# Patient Record
Sex: Female | Born: 1940
Health system: Southern US, Community
[De-identification: ages and names within clinical notes are randomized; demographics above are authoritative.]

## PROBLEM LIST (undated history)

## (undated) DIAGNOSIS — E119 Type 2 diabetes mellitus without complications: Secondary | ICD-10-CM

## (undated) DIAGNOSIS — I10 Essential (primary) hypertension: Secondary | ICD-10-CM

---

## 2015-10-14 ENCOUNTER — Telehealth: Payer: Self-pay | Admitting: *Deleted

## 2015-10-14 NOTE — Telephone Encounter (Signed)
EDCM met with pt and grand daughter Michelle Hood) this morning regarding orange card process.  EDCM and [place]clair [place], [place] Specialist called Michelle Hood and advised her to keep appointment with [facility] financial counselor as they will be able to help them navigate the process of obtaining PCP and needed referrals.  Michelle Hood very appreciative of information provided and advise given.

## 2015-10-21 ENCOUNTER — Ambulatory Visit: Payer: Self-pay | Attending: Internal Medicine

## 2015-10-23 LAB — GLUCOSE, POCT (MANUAL RESULT ENTRY): POC GLUCOSE: 91 mg/dL (ref 70–99)

## 2015-11-03 ENCOUNTER — Encounter: Payer: Self-pay | Admitting: Internal Medicine

## 2015-11-03 ENCOUNTER — Ambulatory Visit: Payer: Self-pay | Attending: Internal Medicine | Admitting: Internal Medicine

## 2015-11-03 ENCOUNTER — Ambulatory Visit (HOSPITAL_COMMUNITY)
Admission: RE | Admit: 2015-11-03 | Discharge: 2015-11-03 | Disposition: A | Discharge status: Home / Self Care | Payer: Self-pay | Source: Ambulatory Visit | Attending: Internal Medicine | Admitting: Internal Medicine

## 2015-11-03 VITALS — BP 176/98 | HR 78 | Temp 98.3°F | Wt 168.2 lb

## 2015-11-03 DIAGNOSIS — M25561 Pain in right knee: Secondary | ICD-10-CM | POA: Insufficient documentation

## 2015-11-03 DIAGNOSIS — R6889 Other general symptoms and signs: Secondary | ICD-10-CM

## 2015-11-03 DIAGNOSIS — M25562 Pain in left knee: Secondary | ICD-10-CM | POA: Insufficient documentation

## 2015-11-03 DIAGNOSIS — I10 Essential (primary) hypertension: Secondary | ICD-10-CM | POA: Insufficient documentation

## 2015-11-03 DIAGNOSIS — H578 Other specified disorders of eye and adnexa: Secondary | ICD-10-CM | POA: Insufficient documentation

## 2015-11-03 LAB — CBC WITH DIFFERENTIAL/PLATELET
BASOS ABS: 0 {cells}/uL (ref 0–200)
Basophils Relative: 0 %
EOS ABS: 100 {cells}/uL (ref 15–500)
Eosinophils Relative: 1 %
HCT: 43.9 % (ref 35.0–45.0)
Hemoglobin: 14.6 g/dL (ref 11.7–15.5)
LYMPHS PCT: 25 %
Lymphs Abs: 2500 cells/uL (ref 850–3900)
MCH: 28.1 pg (ref 27.0–33.0)
MCHC: 33.3 g/dL (ref 32.0–36.0)
MCV: 84.4 fL (ref 80.0–100.0)
MONOS PCT: 8 %
MPV: 10.3 fL (ref 7.5–12.5)
Monocytes Absolute: 800 cells/uL (ref 200–950)
NEUTROS ABS: 6600 {cells}/uL (ref 1500–7800)
Neutrophils Relative %: 66 %
PLATELETS: 268 10*3/uL (ref 140–400)
RBC: 5.2 MIL/uL — ABNORMAL HIGH (ref 3.80–5.10)
RDW: 14.6 % (ref 11.0–15.0)
WBC: 10 10*3/uL (ref 3.8–10.8)

## 2015-11-03 MED ORDER — HYDROCHLOROTHIAZIDE 25 MG PO TABS: 25.0000 mg | ORAL_TABLET | Freq: Every day | ORAL | Status: DC

## 2015-11-03 MED ORDER — LORATADINE 10 MG PO TABS: 10.0000 mg | ORAL_TABLET | Freq: Every day | ORAL | Status: AC

## 2015-11-03 MED ORDER — DICLOFENAC SODIUM 1 % TD GEL: 2.0000 g | Freq: Four times a day (QID) | TRANSDERMAL | Status: AC

## 2015-11-03 MED ORDER — SALINE SENSITIVE EYES SOLN: 1.0000 "application " | Status: AC | PRN

## 2015-11-03 MED ORDER — TRAMADOL HCL 50 MG PO TABS: 50.0000 mg | ORAL_TABLET | Freq: Three times a day (TID) | ORAL | Status: AC | PRN

## 2015-11-03 MED ORDER — METOPROLOL TARTRATE 50 MG PO TABS: 50.0000 mg | ORAL_TABLET | Freq: Two times a day (BID) | ORAL | Status: DC

## 2015-11-03 MED FILL — HYDROCHLOROTHIAZIDE 25 MG T: 25 | 30 days supply | Qty: 30 | Fill #0

## 2015-11-03 MED FILL — VOLTAREN 1% GEL: 1 | 20 days supply | Qty: 100 | Fill #0

## 2015-11-03 MED FILL — ?METOPROLOL 50 MG TABLET: 50 | 30 days supply | Qty: 60 | Fill #0

## 2015-11-03 MED FILL — traMADol HCL 50 MG TABS: 50 | 10 days supply | Qty: 30 | Fill #0

## 2015-11-03 MED FILL — LORATADINE 10 MG TABLET: 10 | 30 days supply | Qty: 30 | Fill #0

## 2015-11-03 NOTE — Patient Instructions (Signed)
Plan de alimentacin con bajo contenido de sodio (Low-Sodium Eating Plan) El sodio aumenta la presin arterial y hace que el cuerpo retenga lquidos. El consumo de alimentos con menos sodio ayuda a Conservator, museum/gallery presin arterial, a Building services engineer y a Physicist, medical, el hgado y los riones. Agregar sal (cloruro de sodio) a los alimentos aumenta el aporte de [place]. La mayor parte del sodio proviene de los alimentos enlatados, envasados y congelados. La pizza, la comida rpida y la comida de los restaurantes tambin contienen mucho sodio. Aunque usted tome medicamentos para bajar la presin arterial o reducir el lquido del cuerpo, es importante que disminuya el aporte de sodio de los alimentos. EN QU CONSISTE EL PLAN? La Harley-Davidson de las personas deberan limitar la ingesta de sodio a 2300mg  por Futures trader. El mdico le recomienda que limite su consumo de sodio a 2 grams Google.  QU DEBO SABER ACERCA DE ESTE PLAN DE ALIMENTACIN? Para el plan de alimentacin con bajo contenido de sodio, debe seguir estas pautas generales:  Elija alimentos con un valor porcentual diario de sodio de menos del 5% (segn se indica en la etiqueta).  Use hierbas o aderezos sin sal, en lugar de sal de mesa o sal marina.  Consulte al mdico o farmacutico antes de usar sustitutos de la sal.  Coma alimentos frescos.  Coma ms frutas y verduras.  Limite las verduras enlatadas. Si las consume, enjuguelas bien para disminuir el sodio.  Limite el consumo de queso a 1onza (28g) por Futures trader.  Coma productos con bajo contenido de sodio, cuya etiqueta suele decir "bajo contenido de sodio" o "sin agregado de sal".  Evite alimentos que contengan glutamato monosdico (MSG), que a veces se agrega a la comida [place] y a algunos alimentos enlatados.  Consulte las etiquetas de los alimentos (etiquetas de informacin nutricional) para saber cunto sodio contiene una porcin.  Consuma ms comida casera y menos de restaurante,  de buf y comida rpida.  Cuando coma en un restaurante, pida que preparen su comida con menos sal o, en lo posible, sin nada de sal. CMO LEO LA INFORMACIN SOBRE EL SODIO EN LAS ETIQUETAS DE LOS ALIMENTOS? La etiqueta de informacin nutricional indica la cantidad de sodio en una porcin de alimento. Si come ms de una porcin, debe multiplicar la cantidad indicada de sodio por la cantidad de porciones. Las etiquetas de los alimentos tambin pueden indicar lo siguiente:  Sin sodio: menos de 5mg  por porcin.  Cantidad muy baja de sodio: 35mg  o menos por porcin.  Cantidad baja de sodio: 140mg  o menos por porcin.  Menor cantidad de sodio: 50% menos de sodio en una porcin. Por ejemplo, si un alimento generalmente contiene 300 mg de sodio se modifica para ser Edison International, tendr 150 mg de sodio.  Sodio reducido: 25% menos de Industrial/product designer. Por ejemplo, si un alimento que por lo general contiene 400mg  de sodio se modifica para convertirse en un alimento de sodio reducido, tendr 300mg  de sodio. QU ALIMENTOS PUEDO COMER? Cereales Cereales con bajo contenido de sodio, como [place], arroz y trigo [place], y trigo triturado. Galletas con bajo contenido de [place]. Arroz y pastas sin sal. Pan con bajo contenido de [place].  Verduras Verduras frescas o congeladas. Verduras enlatadas con bajo contenido de sodio o reducido de sodio. Pasta y salsa de tomate con contenido bajo o reducido de sodio. Jugos de tomate y verduras con contenido bajo o reducido de sodio.  Nils Pyle Frutas frescas,  congeladas y enlatadas. Jugo de frutas.  Carnes y otros productos con protenas Atn y salmn enlatado con bajo contenido de [place]. Carne de vaca o ave, pescado y frutos de mar frescos o congelados. Cordero. Frutos secos sin sal. Lentejas, frijoles y guisantes secos, sin sal agregada. Frijoles enlatados sin sal. Sopas caseras sin sal. Huevos.  Lcteos Leche. Leche de soja. Queso ricota. Quesos  con contenido bajo o reducido de sodio. Yogur.  Condimentos Hierbas y especias frescas y secas. Aderezos sin sal. Cebolla y ajo en polvo. Variedades de mostaza y ketchup con bajo contenido de [place]. Rbano picante fresco o refrigerado. Jugo de limn.  Grasas y aceites Aderezos para ensalada con contenido reducido de [place]. Mantequilla sin sal.  Otros Palomitas de maz y pretzels sin sal.  Los artculos mencionados arriba pueden no ser Raytheon de las bebidas o los alimentos recomendados. Comunquese con el nutricionista para conocer ms opciones. QU ALIMENTOS NO SE RECOMIENDAN? Cereales  Cereales instantneos para comer caliente. Mezclas para bizcochos, panqueques y rellenos de pan. Crutones. Mezclas para pastas o arroz con condimento. Envases comerciales de sopa de fideos. Macarrones con queso envasados o congelados. Harina leudante. Galletas saladas comunes. Verduras Verduras enlatadas comunes. Pasta y salsa de tomate en lata comunes. Jugos comunes de tomate y de verduras. Verduras Hydrologist. Papas fritas saladas. Aceitunas. Pepinillos. Salsas. Chucrut. Salsa. Carnes y otros productos con protenas Carne de vaca, pescado o frutos de mar que est salada, [employer], [place], condimentada con especias o con pickles. Panceta, jamn, salchichas, perros calientes, carne curada, carne picada (carne envasada de buey) y embutidos. Cerdo salado. Cecina o charqui. Arenque en escabeche. Anchoas, atn enlatado comn y sardinas. Frutos secos con sal. Celine Mans para untar y quesos procesados. Requesn. Queso azul y cottage. Suero de [place].  Condimentos Sal de cebolla y ajo, sal condimentada, sal de mesa y sal marina. Salsas en lata y envasadas. Salsa [place]. Salsa trtara. Salsa barbacoa. Salsa teriyaki. Salsa de soja, incluso la que tiene contenido reducido de [place]. Salsa de carne. Salsa de pescado. Salsa de [place]. Salsa rosada. Rbano picante envasado. Ketchup y mostaza  comunes. Saborizantes y tiernizantes para carne. Caldo en cubitos. Salsa picante. Salsa tabasco. Adobos. Aderezos para tacos. Salsas. Grasas y aceites Aderezos comunes para ensalada. Mantequilla con sal. Margarina. Mantequilla clarificada. Grasa de panceta.  Otros Nachos y papas fritas envasadas. Maz inflado y frituras de maz. Palomitas de maz y pretzels con sal. Sopas enlatadas o en polvo. Pizza. Pasteles y entradas congeladas.  Los artculos mencionados arriba pueden no ser Raytheon de las bebidas y los alimentos que se Theatre stage manager. Comunquese con el nutricionista para obtener ms informacin.   Esta informacin no tiene Theme park manager el consejo del mdico. Asegrese de hacerle al mdico cualquier pregunta que tenga.   Document Released: 05/23/2005 Document Revised: 06/13/2014 Elsevier Interactive Patient Education 2016 [employer].  - Plan de alimentacin DASH (DASH Eating Plan) DASH es la sigla en ingls de "Enfoques Alimentarios para Detener la Hipertensin". El plan de alimentacin DASH ha demostrado bajar la presin arterial elevada (hipertensin). Los beneficios adicionales para la salud pueden incluir la disminucin del riesgo de diabetes mellitus tipo2, enfermedades cardacas e ictus. Este plan tambin puede ayudar a Geophysical data processor. QU DEBO SABER ACERCA DEL PLAN DE ALIMENTACIN DASH? Para el plan de alimentacin DASH, seguir las siguientes pautas generales:  Elija los alimentos con un valor porcentual diario de sodio de menos del 5% (segn figura en la etiqueta del alimento).  Use hierbas o aderezos sin sal, en lugar de sal de mesa o sal marina.  Consulte al mdico o farmacutico antes de usar sustitutos de la sal.  Coma productos con bajo contenido de sodio, cuya etiqueta suele decir "bajo contenido de sodio" o "sin agregado de sal".  Coma alimentos frescos.  Coma ms verduras, frutas y productos lcteos con bajo contenido de [place].  Elija los  cereales integrales. Busque la palabra "integral" en Estate agent de la lista de ingredientes.  Elija el pescado y el pollo o el pavo sin piel ms a menudo que las carnes rojas. Limite el consumo de pescado, carne de ave y carne a 6onzas (170g) por Futures trader.  Limite el consumo de dulces, postres, azcares y bebidas azucaradas.  Elija las grasas saludables para el corazn.  Limite el consumo de queso a 1onza (28g) por Futures trader.  Consuma ms comida casera y menos de restaurante, de buf y comida rpida.  Limite el consumo de alimentos fritos.  Cocine los alimentos utilizando mtodos que no sean la fritura.  Limite las verduras enlatadas. Si las consume, enjuguelas bien para disminuir el sodio.  Cuando coma en un restaurante, pida que preparen su comida con menos sal o, en lo posible, sin nada de sal. QU ALIMENTOS PUEDO COMER? Pida ayuda a un nutricionista para conocer las necesidades calricas individuales. Cereales Pan de salvado o integral. Arroz integral. Pastas de salvado o integrales. Quinua, trigo burgol y cereales integrales. Cereales con bajo contenido de sodio. Tortillas de harina de maz o de salvado. Pan de maz integral. Galletas saladas integrales. Galletas con bajo contenido de [place]. Vegetales Verduras frescas o congeladas (crudas, al vapor, asadas o grilladas). Jugos de tomate y verduras con contenido bajo o reducido de sodio. Pasta y salsa de tomate con contenido bajo o reducido de sodio. Verduras enlatadas con bajo contenido de sodio o reducido de sodio.  Nils Pyle Nils Pyle frescas, en conserva (en su jugo natural) o frutas congeladas. Carnes y otros productos con protenas Carne de res molida (al 85% o ms [place]), carne de res de animales alimentados con pastos o carne de res sin la grasa. Pollo o pavo sin piel. Carne de pollo o de [place]. Cerdo sin la grasa. Todos los pescados y frutos de mar. Huevos. Porotos, guisantes o lentejas secos. Frutos secos y semillas sin sal.  Frijoles enlatados sin sal. Lcteos Productos lcteos con bajo contenido de grasas, como [place] o al 1%, quesos reducidos en grasas o al 2%, ricota con bajo contenido de grasas o [employer], o yogur natural con bajo contenido de [place]. Quesos con contenido bajo o reducido de sodio. Grasas y Writer en barra que no contengan grasas trans. [place] y alios para ensaladas livianos o reducidos en grasas (reducidos en sodio). [place]. Aceites de crtamo, oliva o canola. Mantequilla natural de man o almendra. Otros Palomitas de maz y pretzels sin sal. Los artculos mencionados arriba pueden no ser Raytheon de las bebidas o los alimentos recomendados. Comunquese con el nutricionista para conocer ms opciones. QU ALIMENTOS NO SE RECOMIENDAN? Cereales Pan blanco. Pastas blancas. Arroz blanco. Pan de maz refinado. Bagels y croissants. Galletas saladas que contengan grasas trans. Vegetales Vegetales con crema o fritos. Verduras en salsa de [place]. Verduras enlatadas comunes. Pasta y salsa de tomate en lata comunes. Jugos comunes de tomate y de verduras. Nils Pyle Frutas secas. Fruta enlatada en almbar liviano o espeso. Jugo de frutas. Carnes y otros productos con protenas [place] de carne con  grasa. Costillas, alas de pollo, tocineta, salchicha, mortadela, salame, chinchulines, tocino, perros calientes, salchichas alemanas y embutidos envasados. Frutos secos y semillas con sal. Frijoles con sal en lata. Lcteos Leche entera o al 2%, crema, mezcla de [place] y crema, y queso crema. Yogur entero o endulzado. Quesos o queso azul con alto contenido de Neurosurgeon. Cremas no lcteas y coberturas batidas. Quesos procesados, quesos para untar o cuajadas. Condimentos Sal de cebolla y ajo, sal condimentada, sal de mesa y sal marina. Salsas en lata y envasadas. Salsa [place]. Salsa trtara. Salsa barbacoa. Salsa teriyaki. Salsa de soja, incluso la que tiene contenido  reducido de [place]. Salsa de carne. Salsa de pescado. Salsa de [place]. Salsa rosada. Rbano picante. Ketchup y mostaza. Saborizantes y tiernizantes para carne. Caldo en cubitos. Salsa picante. Salsa tabasco. Adobos. Aderezos para tacos. Salsas. Grasas y [address], [place] en barra, [place] de [place], [place], [place] clarificada y Steffanie [place] de tocino. Aceites de coco, de palmiste o de palma. Aderezos comunes para ensalada. Otros Pickles y [place]. Palomitas de maz y pretzels con sal. Los artculos mencionados arriba pueden no ser Raytheon de las bebidas y los alimentos que se Theatre stage manager. Comunquese con el nutricionista para obtener ms informacin. DNDE Raelyn Mora MS INFORMACIN? Instituto Nacional del [place], del Pulmn y de Risk manager (National Heart, Lung, and Blood Institute): [email or web address]   Esta informacin no tiene Theme park manager el consejo del mdico. Asegrese de hacerle al mdico cualquier pregunta que tenga.   Document Released: 05/12/2011 Document Revised: 06/13/2014 Elsevier Interactive Patient Education 2016 [employer].   - Dolor de rodilla (Knee Pain) El dolor de rodilla es un problema frecuente y puede tener muchas causas. A menudo desaparece si se siguen las instrucciones del mdico para el cuidado Facilities manager. El tratamiento del dolor continuo depender de su causa. Si el dolor persiste, tal vez haya que realizar ms estudios para Scientist, forensic, los cuales pueden incluir radiografas u otros estudios de diagnstico por imgenes de la rodilla. CUIDADOS EN EL HOGAR  Tome los medicamentos solamente como se lo haya indicado el mdico.  Mantenga la rodilla en reposo y en alto (elevada) mientras est descansando.  No haga cosas que le causen dolor o que lo intensifiquen.  Evite las Ball Corporation ambos pies se separan del suelo al mismo tiempo, por ejemplo, correr, saltar la soga o  hacer saltos de tijera.  Aplique hielo sobre la zona de la rodilla:  Ponga el hielo en una bolsa plstica.  Coloque una toalla entre la piel y la bolsa de hielo.  Coloque el hielo durante 20 minutos, 2 a 3 veces por da.  Pregntele al mdico si debe usar una Neurosurgeon.  Duerma con una almohada debajo de la rodilla.  Baje de peso si es necesario. El sobrepeso puede aumentar el dolor de rodilla.  No consuma ningn producto que contenga tabaco, lo que incluye cigarrillos, tabaco de Theatre manager o Administrator, Civil Service. Si necesita ayuda para dejar de fumar, consulte al mdico. Fumar puede retrasar la curacin de cualquier problema que tenga en el hueso y Nurse, learning disability. SOLICITE AYUDA SI:  El dolor de rodilla no desaparece, cambia o empeora.  Tiene fiebre junto con dolor de rodilla.  La rodilla le falla o se le queda trabada.  La rodilla est ms hinchada. SOLICITE AYUDA DE INMEDIATO SI:   La rodilla est caliente al tacto.  Tiene dolor en el pecho o dificultad para respirar.   Esta informacin no tiene  como fin reemplazar el consejo del mdico. Asegrese de hacerle al mdico cualquier pregunta que tenga.   Document Released: 12/18/2013 Elsevier Interactive Patient Education [employer]2016 Elsevier Inc.

## 2015-11-03 NOTE — Progress Notes (Signed)
Michelle Hood, is a 75 y.o. female  ZOX:[id number]  [id number]  DOB - 23-Jun-1940  CC:  Chief Complaint  Patient presents with  . New Patient (Initial Visit)    Hypertension       HPI: Michelle Hood is a 75 y.o. female here today to establish medical care, new to our clinic and system. Per pt and family (she is here w/ her Niece and grandniece) she has not seen a doctor in very long time.  She was recently dx w/ htn in [facility]	[place] RN program visit on 10/23/15, where they found her bp 195/107.  She had no sx at time.  Currently she is here for f/u. C/o mainly of bilateral knee pains, left > right that has been very painful these past few days.  Her knees having been bothering her this past year, but not to this extent til recently.  She has been taking tylenol and motrin OTC back and forth w/ minimal relief.    Patient has No headache, no visual problems/blurry vision,  No chest pain, no syncope, No abdominal pain - No Nausea, No new weakness tingling or numbness, No Cough - SOB.  + itchy eyes lately though, maybe allergies.  Interpreter was used to communicate directly with patient for the entire encounter including providing detailed patient instructions.   No Known Allergies No past medical history on file. No current outpatient prescriptions on file prior to visit.   No current facility-administered medications on file prior to visit.   No family history on file. Social History   Social History  . Marital Status: Single    Spouse Name: N/A  . Number of Children: N/A  . Years of Education: N/A   Occupational History  . Not on file.   Social History Main Topics  . Smoking status: Never Smoker   . Smokeless tobacco: Not on file  . Alcohol Use: No  . Drug Use: No  . Sexual Activity: Not on file   Other Topics Concern  . Not on file   Social History Narrative  . No narrative on file    Review of Systems: Per hPI, otherwise all systems  reviewed and negative.   Objective:   Filed Vitals:   11/03/15 1532 11/03/15 1534  BP: 191/79 176/98  Pulse: 84 78  Temp: 98.3 F (36.8 C)     Filed Weights   11/03/15 1532  Weight: 168 lb 3.2 oz (76.295 kg)    BP Readings from Last 3 Encounters:  11/03/15 176/98    Physical Exam: Constitutional: Patient appears well-developed and well-nourished. No distress. AAOx3, morbid obese., pleasant.  Gait very limited due to knee pain and obesity. HENT: Normocephalic, atraumatic, External right and left ear normal. Oropharynx is clear and moist.   bilat TMs clear. Eyes: Conjunctivae and EOM are normal. PERRL, no scleral icterus.  Neck: Normal ROM. Neck supple. No JVD. No tracheal deviation. No thyromegaly. CVS: RRR, S1/S2 +, no murmurs, no gallops, no carotid bruit.  Pulmonary: Effort and breath sounds normal, no stridor, rhonchi, wheezes, rales.  Abdominal: Soft. BS +, no distension, tenderness, rebound or guarding.  Musculoskeletal:   No edema/erythem/warthm /effusion noted of knees bilaterally.  TPP anterior aspect of bilateral knees, left > right knee, ttp w/ extension /flexion/rotation of knees.  Difficult exam though given pt's body habitus/.  LE: bilat/ no c/c/e, pulses 2+ bilateral. +varicose veins. Neuro: Alert.   muscle tone coordination. No cranial nerve deficit grossly. Skin: Skin is warm and  dry. No rash noted. Not diaphoretic. No erythema. No pallor. Psychiatric: Normal mood and affect. Behavior, judgment, thought content normal.  No results found for: WBC, HGB, HCT, MCV, PLT No results found for: CREATININE, BUN, NA, K, CL, CO2  No results found for: HGBA1C Lipid Panel  No results found for: CHOL, TRIG, HDL, CHOLHDL, VLDL, LDLCALC     [facility] 11/03/2015  Decreased Interest 0  Down, Depressed, Hopeless 0  PHQ - 2 Score 0  Altered sleeping 0  Tired, decreased energy 0  Change in appetite 0  Feeling bad or failure about yourself  0  Trouble  concentrating 0  Moving slowly or fidgety/restless 0  Suicidal thoughts 0  PHQ-9 Score 0  Difficult doing work/chores Not difficult at all    Assessment and plan:   1. Knee pain, bilateral, suspect arthritis/OA of knees. - Uric Acid - Sedimentation Rate - Vitamin D, 25-hydroxy - DG Knee Complete 4 Views Left; Future - DG Knee Complete 4 Views Right; Future - trial voltarin gel and ultram prn for now. - intructed pt to take motrin /NSAIDS w/ food  2. HTN (hypertension), malignant, suspect longstanding W/o ha/cp/concerning sx currenlty. - metoprolol 50bid, hctz 25 qday. - BASIC METABOLIC PANEL WITH GFR - CBC with Differential - rechk bp in 2 weeks - low salt/DASH diet dw pt and family.  3. Itchy eyes Suspect prurirtis due to allergies Trial claritin and saline eye drops.  4. Dm screening chk a1c  Return in about 2 weeks (around 11/17/2015) for htn /knee pain, bp check.  The patient was given clear instructions to go to ER or return to medical center if symptoms don't improve, worsen or new problems develop. The patient verbalized understanding. The patient was told to call to get lab results if they haven't heard anything in the next week.      Pete Glatterawn T Bonniejean Piano, MD, MBA/MHA [facility] [place], [place] [phone]   11/03/2015, 5:00 PM

## 2015-11-04 LAB — BASIC METABOLIC PANEL WITH GFR
BUN: 30 mg/dL — ABNORMAL HIGH (ref 7–25)
CALCIUM: 9.7 mg/dL (ref 8.6–10.4)
CO2: 26 mmol/L (ref 20–31)
CREATININE: 0.9 mg/dL (ref 0.60–0.93)
Chloride: 100 mmol/L (ref 98–110)
GFR, Est African American: 73 mL/min (ref >=60)
GFR, Est Non African American: 63 mL/min (ref >=60)
GLUCOSE: 85 mg/dL (ref 65–99)
Potassium: 4.3 mmol/L (ref 3.5–5.3)
Sodium: 138 mmol/L (ref 135–146)

## 2015-11-04 LAB — SEDIMENTATION RATE: Sed Rate: 6 mm/hr (ref 0–30)

## 2015-11-04 LAB — HEMOGLOBIN A1C
Hgb A1c MFr Bld: 6 % — ABNORMAL HIGH (ref <5.7)
Mean Plasma Glucose: 126 mg/dL

## 2015-11-04 LAB — URIC ACID: Uric Acid, Serum: 7.5 mg/dL — ABNORMAL HIGH (ref 2.4–7.0)

## 2015-11-04 LAB — VITAMIN D 25 HYDROXY (VIT D DEFICIENCY, FRACTURES): Vit D, 25-Hydroxy: 30 ng/mL (ref 30–100)

## 2015-11-11 ENCOUNTER — Other Ambulatory Visit: Payer: Self-pay | Admitting: Internal Medicine

## 2015-11-11 DIAGNOSIS — M170 Bilateral primary osteoarthritis of knee: Secondary | ICD-10-CM

## 2015-11-23 ENCOUNTER — Ambulatory Visit: Payer: Self-pay | Attending: Internal Medicine | Admitting: Internal Medicine

## 2015-11-23 DIAGNOSIS — K5900 Constipation, unspecified: Secondary | ICD-10-CM | POA: Insufficient documentation

## 2015-11-23 DIAGNOSIS — Z7984 Long term (current) use of oral hypoglycemic drugs: Secondary | ICD-10-CM | POA: Insufficient documentation

## 2015-11-23 DIAGNOSIS — M25561 Pain in right knee: Secondary | ICD-10-CM | POA: Insufficient documentation

## 2015-11-23 DIAGNOSIS — M1710 Unilateral primary osteoarthritis, unspecified knee: Secondary | ICD-10-CM | POA: Insufficient documentation

## 2015-11-23 DIAGNOSIS — M170 Bilateral primary osteoarthritis of knee: Secondary | ICD-10-CM

## 2015-11-23 DIAGNOSIS — M179 Osteoarthritis of knee, unspecified: Secondary | ICD-10-CM | POA: Insufficient documentation

## 2015-11-23 DIAGNOSIS — R7303 Prediabetes: Secondary | ICD-10-CM

## 2015-11-23 DIAGNOSIS — I10 Essential (primary) hypertension: Secondary | ICD-10-CM

## 2015-11-23 DIAGNOSIS — M25562 Pain in left knee: Secondary | ICD-10-CM | POA: Insufficient documentation

## 2015-11-23 DIAGNOSIS — Z79899 Other long term (current) drug therapy: Secondary | ICD-10-CM | POA: Insufficient documentation

## 2015-11-23 MED ORDER — LISINOPRIL-HYDROCHLOROTHIAZIDE 20-25 MG PO TABS: 1.0000 | ORAL_TABLET | Freq: Every day | ORAL | Status: AC

## 2015-11-23 MED ORDER — METFORMIN HCL ER 500 MG PO TB24: 500.0000 mg | ORAL_TABLET | Freq: Every day | ORAL | Status: AC

## 2015-11-23 MED FILL — LISINOPRIL-HCTZ 20-25 MG TA: 20-25 | 30 days supply | Qty: 30 | Fill #0

## 2015-11-23 MED FILL — METFORMIN HCL ER 500 MG TAB: 500 | 30 days supply | Qty: 30 | Fill #0

## 2015-11-23 NOTE — Patient Instructions (Addendum)
*fianancial aid - they have questions.  Followup ortho appt 12/02/15  Plan de alimentacin con bajo contenido de sodio (Low-Sodium Eating Plan) El sodio aumenta la presin arterial y hace que el cuerpo retenga lquidos. El consumo de alimentos con menos sodio ayuda a Conservator, museum/gallery presin arterial, a Building services engineer y a Physicist, medical, el hgado y los riones. Agregar sal (cloruro de sodio) a los alimentos aumenta el aporte de [place]. La mayor parte del sodio proviene de los alimentos enlatados, envasados y congelados. La pizza, la comida rpida y la comida de los restaurantes tambin contienen mucho sodio. Aunque usted tome medicamentos para bajar la presin arterial o reducir el lquido del cuerpo, es importante que disminuya el aporte de sodio de los alimentos. EN QU CONSISTE EL PLAN? La Harley-Davidson de las personas deberan limitar la ingesta de sodio a 2300mg  por Futures trader. El mdico le recomienda que limite su consumo de sodio a __________ [employer].  QU DEBO SABER ACERCA DE ESTE PLAN DE ALIMENTACIN? Para el plan de alimentacin con bajo contenido de sodio, debe seguir estas pautas generales:  Elija alimentos con un valor porcentual diario de sodio de menos del 5% (segn se indica en la etiqueta).  Use hierbas o aderezos sin sal, en lugar de sal de mesa o sal marina.  Consulte al mdico o farmacutico antes de usar sustitutos de la sal.  Coma alimentos frescos.  Coma ms frutas y verduras.  Limite las verduras enlatadas. Si las consume, enjuguelas bien para disminuir el sodio.  Limite el consumo de queso a 1onza (28g) por Futures trader.  Coma productos con bajo contenido de sodio, cuya etiqueta suele decir "bajo contenido de sodio" o "sin agregado de sal".  Evite alimentos que contengan glutamato monosdico (MSG), que a veces se agrega a la comida [place] y a algunos alimentos enlatados.  Consulte las etiquetas de los alimentos (etiquetas de informacin nutricional) para saber cunto  sodio contiene una porcin.  Consuma ms comida casera y menos de restaurante, de buf y comida rpida.  Cuando coma en un restaurante, pida que preparen su comida con menos sal o, en lo posible, sin nada de sal. CMO LEO LA INFORMACIN SOBRE EL SODIO EN LAS ETIQUETAS DE LOS ALIMENTOS? La etiqueta de informacin nutricional indica la cantidad de sodio en una porcin de alimento. Si come ms de una porcin, debe multiplicar la cantidad indicada de sodio por la cantidad de porciones. Las etiquetas de los alimentos tambin pueden indicar lo siguiente:  Sin sodio: menos de 5mg  por porcin.  Cantidad muy baja de sodio: 35mg  o menos por porcin.  Cantidad baja de sodio: 140mg  o menos por porcin.  Menor cantidad de sodio: 50% menos de sodio en una porcin. Por ejemplo, si un alimento generalmente contiene 300 mg de sodio se modifica para ser Edison International, tendr 150 mg de sodio.  Sodio reducido: 25% menos de Industrial/product designer. Por ejemplo, si un alimento que por lo general contiene 400mg  de sodio se modifica para convertirse en un alimento de sodio reducido, tendr 300mg  de sodio. QU ALIMENTOS PUEDO COMER? Cereales Cereales con bajo contenido de sodio, como [place], arroz y trigo [place], y trigo triturado. Galletas con bajo contenido de [place]. Arroz y pastas sin sal. Pan con bajo contenido de [place].  Verduras Verduras frescas o congeladas. Verduras enlatadas con bajo contenido de sodio o reducido de sodio. Pasta y salsa de tomate con contenido bajo o reducido de sodio. Jugos de tomate y verduras  con contenido bajo o reducido de sodio.  Nils Pyle Frutas frescas, congeladas y Primary school teacher. Jugo de frutas.  Carnes y otros productos con protenas Atn y salmn enlatado con bajo contenido de [place]. Carne de vaca o ave, pescado y frutos de mar frescos o congelados. Cordero. Frutos secos sin sal. Lentejas, frijoles y guisantes secos, sin sal agregada. Frijoles enlatados sin sal. Sopas  caseras sin sal. Huevos.  Lcteos Leche. Leche de soja. Queso ricota. Quesos con contenido bajo o reducido de sodio. Yogur.  Condimentos Hierbas y especias frescas y secas. Aderezos sin sal. Cebolla y ajo en polvo. Variedades de mostaza y ketchup con bajo contenido de [place]. Rbano picante fresco o refrigerado. Jugo de limn.  Grasas y aceites Aderezos para ensalada con contenido reducido de [place]. Mantequilla sin sal.  Otros Palomitas de maz y pretzels sin sal.  Los artculos mencionados arriba pueden no ser Raytheon de las bebidas o los alimentos recomendados. Comunquese con el nutricionista para conocer ms opciones. QU ALIMENTOS NO SE RECOMIENDAN? Cereales  Cereales instantneos para comer caliente. Mezclas para bizcochos, panqueques y rellenos de pan. Crutones. Mezclas para pastas o arroz con condimento. Envases comerciales de sopa de fideos. Macarrones con queso envasados o congelados. Harina leudante. Galletas saladas comunes. Verduras Verduras enlatadas comunes. Pasta y salsa de tomate en lata comunes. Jugos comunes de tomate y de verduras. Verduras Hydrologist. Papas fritas saladas. Aceitunas. Pepinillos. Salsas. Chucrut. Salsa. Carnes y otros productos con protenas Carne de vaca, pescado o frutos de mar que est salada, [place], [place], condimentada con especias o con pickles. Panceta, jamn, salchichas, perros calientes, carne curada, carne picada (carne envasada de buey) y embutidos. Cerdo salado. Cecina o charqui. Arenque en escabeche. Anchoas, atn enlatado comn y sardinas. Frutos secos con sal. Celine Mans para untar y quesos procesados. Requesn. Queso azul y cottage. Suero de [place].  Condimentos Sal de cebolla y ajo, sal condimentada, sal de mesa y sal marina. Salsas en lata y envasadas. Salsa [place]. Salsa trtara. Salsa barbacoa. Salsa teriyaki. Salsa de soja, incluso la que tiene contenido reducido de [place]. Salsa de carne. Salsa de  pescado. Salsa de [place]. Salsa rosada. Rbano picante envasado. Ketchup y mostaza comunes. Saborizantes y tiernizantes para carne. Caldo en cubitos. Salsa picante. Salsa tabasco. Adobos. Aderezos para tacos. Salsas. Grasas y aceites Aderezos comunes para ensalada. Mantequilla con sal. Margarina. Mantequilla clarificada. Grasa de panceta.  Otros Nachos y papas fritas envasadas. Maz inflado y frituras de maz. Palomitas de maz y pretzels con sal. Sopas enlatadas o en polvo. Pizza. Pasteles y entradas congeladas.  Los artculos mencionados arriba pueden no ser Ray[employer]on de las bebidas y los alimentos que se Theatre stage manager. Comunquese con el nutricionista para obtener ms informacin.   Esta informacin no tiene Theme park manager el consejo del mdico. Asegrese de hacerle al mdico cualquier pregunta que tenga.   Document Released: 05/23/2005 Document Revised: 06/13/2014 Elsevier Interactive Patient Education 2016 [employer].   - La diabetes mellitus y los alimentos (Diabetes Mellitus and Food) Es importante que controle su nivel de azcar en la sangre (glucosa). El nivel de glucosa en sangre depende en gran medida de lo que usted come. Comer alimentos saludables en las cantidades [place] a lo largo del Futures trader, aproximadamente a la misma hora [employer], lo ayudar a Chief Operating Officer su nivel de Event organiser. Tambin puede ayudarlo a retrasar o Fish farm manager de la diabetes mellitus. Comer de [employer] saludable incluso puede ayudarlo a Event organiser  de presin arterial y a Barista o Pharmacologist un peso saludable.  Entre las recomendaciones generales para alimentarse y Water quality scientist los alimentos de forma saludable, se incluyen las siguientes:  Respetar las comidas principales y comer colaciones con regularidad. Evitar pasar largos perodos sin comer con el fin de perder peso.  Seguir una dieta que consista principalmente en alimentos de origen vegetal, como frutas, vegetales,  frutos secos, legumbres y cereales integrales.  Utilizar mtodos de coccin a baja temperatura, como hornear, en lugar de mtodos de coccin a alta temperatura, como frer en abundante aceite. Trabaje con el nutricionista para aprender a Acupuncturist nutricional de las etiquetas de los alimentos. CMO PUEDEN AFECTARME LOS ALIMENTOS? Carbohidratos Los carbohidratos afectan el nivel de glucosa en sangre ms que cualquier otro tipo de alimento. El nutricionista lo ayudar a Chief Strategy Officer cuntos carbohidratos puede consumir en cada comida y ensearle a contarlos. El recuento de carbohidratos es importante para mantener la glucosa en sangre en un nivel saludable, en especial si utiliza insulina o toma determinados medicamentos para la diabetes mellitus. Alcohol El alcohol puede provocar disminuciones sbitas de la glucosa en sangre (hipoglucemia), en especial si utiliza insulina o toma determinados medicamentos para la diabetes mellitus. La hipoglucemia es una afeccin que puede poner en peligro la vida. Los sntomas de la hipoglucemia (somnolencia, mareos y Administrator) son similares a los sntomas de haber consumido mucho alcohol.  Si el mdico lo autoriza a beber alcohol, hgalo con moderacin y siga estas pautas:  Las mujeres no deben beber ms de un trago por da, y los hombres no deben beber ms de dos tragos por Futures trader. Un trago es igual a:  12 onzas (355 ml) de cerveza  5 onzas de vino (150 ml) de vino  1,5onzas (45ml) de bebidas espirituosas  No beba con el estmago vaco.  Mantngase hidratado. Beba agua, gaseosas dietticas o t helado sin azcar.  Las gaseosas comunes, los jugos y otros refrescos podran contener muchos carbohidratos y se Heritage manager. QU ALIMENTOS NO SE RECOMIENDAN? Cuando haga las elecciones de alimentos, es importante que recuerde que todos los alimentos son distintos. Algunos tienen menos nutrientes que otros por porcin, aunque podran tener la misma  cantidad de caloras o carbohidratos. Es difcil darle al cuerpo lo que necesita cuando consume alimentos con menos nutrientes. Estos son algunos ejemplos de alimentos que debera evitar ya que contienen muchas caloras y carbohidratos, pero pocos nutrientes:  Neurosurgeon trans (la mayora de los alimentos procesados incluyen grasas trans en la etiqueta de Informacin nutricional).  Gaseosas comunes.  Jugos.  Caramelos.  Dulces, como tortas, pasteles, rosquillas y [place].  Comidas fritas. QU ALIMENTOS PUEDO COMER? Consuma alimentos ricos en nutrientes, que nutrirn el cuerpo y lo mantendrn saludable. Los alimentos que debe comer tambin dependern de varios factores, como:  Las caloras que necesita.  Los medicamentos que toma.  Su peso.  El nivel de glucosa en [place].  El [place] de presin arterial.  El nivel de colesterol. Debe consumir una amplia variedad de alimentos, por ejemplo:  Protenas.  Cortes de Target Corporation.  Protenas con bajo contenido de grasas saturadas, como pescado, clara de huevo y frijoles. Evite las carnes procesadas.  Frutas y vegetales.  Frutas y Sports administrator que pueden ayudar a Chief Operating Officer los niveles sanguneos de [place], como [place], mangos y batatas.  Productos lcteos.  Elija productos lcteos sin grasa o con bajo contenido de [place], como [place], yogur y [place].  Cereales, panes, pastas y arroz.  Elija cereales integrales, como panes  multicereales, avena en grano y arroz integral. Estos alimentos pueden ayudar a controlar la presin arterial.  Grasas.  Alimentos que contengan grasas saludables, como frutos secos, Chartered certified accountantaguacate, aceite de [place]oliva, aceite de canola y pescado. TODOS LOS QUE PADECEN DIABETES MELLITUS TIENEN EL MISMO PLAN DE COMIDAS? Dado que todas las personas que padecen diabetes mellitus son distintas, no hay un solo plan de comidas que funcione para todos. Es muy importante que se rena con un nutricionista que lo ayudar a crear un  plan de comidas adecuado para usted.   Esta informacin no tiene Theme park managercomo fin reemplazar el consejo del mdico. Asegrese de hacerle al mdico cualquier pregunta que tenga.   Document Released: 08/30/2007 Document Revised: 06/13/2014 Elsevier Interactive Patient Education [employer]2016 Elsevier Inc.  - Diabetes y actividad fsica (Diabetes and Exercise) Hacer actividad fsica con regularidad es muy importante. No se trata solo de Johnson Controlsperder peso. Tiene muchos otros beneficios, como por ejemplo:  Mejorar el estado fsico, la flexibilidad y la resistencia.  Aumenta la densidad sea.  Ayuda a Art gallery managercontrolar el peso.  Disminuye la Art gallery managergrasa corporal.  Aumenta la fuerza muscular.  Reduce el estrs y las tensiones.  Mejora el estado de salud general. Las personas diabticas que realizan actividad fsica tienen beneficios adicionales debido al ejercicio:  Reduce el apetito.  El organismo mejora el uso del azcar (glucosa) de la [place].  Ayuda a disminuir o Engineer, maintenance (IT)controlar la glucosa en la sangre.  Disminuye la presin arterial.  Ayuda a disminuir los lpidos en la sangre (colesterol y triglicridos).  El organismo mejora el uso de la insulina porque:  Aumenta la sensibilidad del organismo a la insulina.  Reduce las necesidades de insulina del organismo.  Disminuye el riesgo de enfermedad cardaca por la actividad fsica ya que  disminuye el colesterol y TEPPCO Partnerstambin los triglicridos.  Aumenta los niveles de colesterol bueno (como las lipoprotenas de alta densidad [HDL]) en el organismo.  Disminuye los niveles de glucosa en la [place]. SU PLAN DE ACTIVIDAD  Elija una actividad que disfrute y establezca objetivos realistas. Para ejercitarse sin riesgos, debe comenzar a Education administratorpracticar cualquier actividad fsica nueva lentamente y aumentar la intensidad del ejercicio de forma gradual con el tiempo. Su mdico o educador en diabetes podrn ayudarlo a crear un plan de actividades que lo beneficie. Las recomendaciones  generales incluyen lo siguiente:  Air cabin crewAlentar a los nios para que realicen al menos 60 minutos de actividad fsica Management consultantcada da.  Estirarse y Education officer, environmentalrealizar ejercicios de entrenamiento de la fuerza, como yoga o levantamiento de pesas, por lo menos 2 veces por semana.  Realizar en total por lo menos 150 minutos de ejercicios de intensidad moderada cada semana, como caminar a paso ligero o hacer gimnasia acutica.  Hacer ejercicio fsico por lo menos 3 das por semana y no dejar pasar ms de 2 das seguidos sin ejercitarse.  Evitar los perodos largos de inactividad (90 minutos o ms tiempo). Cuando deba pasar mucho tiempo sentado, haga pausas frecuentes para caminar o estirarse. RECOMENDACIONES PARA REALIZAR EJERCICIOS CUANDO SE TIENE DIABETES TIPO 1 O TIPO 2   Controle la glucosa en la sangre antes de comenzar. Si el nivel de glucosa en la sangre es de ms de 240 mg/dl, controle las cetonas en la [place]. No haga actividad fsica si hay cetonas.  Evite inyectarse insulina en las zonas del cuerpo que ejercitar. Por ejemplo, evite inyectarse insulina en:  Los brazos, si juega al tenis.  Las piernas, si corre.  Lleve un registro de:  Los ConocoPhillipsalimentos que  consume antes y despus de hacer el ejercicio.  Los momentos esperables de picos de accin de la insulina.  Los niveles de glucosa en la sangre antes y despus de hacer ejercicios.  El tipo y cantidad de [place] fsica que Biomedical engineer.  Revise los registros con su mdico. El mdico lo ayudar a Environmental education officer pautas para ajustar la cantidad de alimento y las cantidades de insulina antes y despus de Radio producer ejercicios.  Si toma insulina o agentes hipoglucemiantes por va oral, observe si hay signos y sntomas de hipoglucemia. Entre los que se incluyen:  Mareos.  Temblores.  Sudoracin.  Escalofros.  Confusin.  Beba gran cantidad de agua mientras hace ejercicios para evitar la deshidratacin o los golpes de Airline pilot. Durante la actividad fsica se  pierde agua corporal que se debe reponer.  Comente con su mdico antes de comenzar un programa de actividad fsica para verificar que sea seguro para usted. Recuerde, cualquier actividad es mejor que ninguna.   Esta informacin no tiene Theme park manager el consejo del mdico. Asegrese de hacerle al mdico cualquier pregunta que tenga.   Document Released: 06/12/2007 Document Revised: 10/07/2014 Elsevier Interactive Patient Education 2016 [employer].  - Consejos para comer fuera de su casa si tiene diabetes (Tips for Eating Away From Home If You Have Diabetes) El control del nivel de glucemia, que tambin se conoce como azcar en la [place], puede ser un reto, que se complica an ms cuando uno no prepara sus propias comidas. Los siguientes consejos pueden ayudarlo a Chief Operating Officer la diabetes cuando come fuera de su casa. PLANIFICACIN Organcese si sabe que comer fuera de su casa:  Pregntele al mdico cmo sincronizar las comidas y el medicamento si est en tratamiento con insulina.  Haga una lista de restaurantes cercanos que ofrezcan opciones saludables. Si tienen un men que pueda leer en su casa, llvelo y planifique lo que pedir con anticipacin.  Busque informacin en lnea del restaurante donde quiera comer. Muchos restaurantes de comida rpida y cadenas de restaurantes incluyen la informacin nutricional en lnea. Tenga en cuenta esta informacin para elegir las opciones ms saludables y calcular los carbohidratos de la comida.  Use un libro de recuento de carbohidratos o una aplicacin mvil para fijarse en el contenido de carbohidratos y el tamao de porcin de lo que desea comer.  Comience a Armed forces training and education officer de las porciones y a Public house manager cuntas porciones hay en una unidad. Esto le permitir calcular la cantidad de carbohidratos que puede comer. ALIMENTOS LIBRES Un "alimento libre" es cualquier alimento o bebida que contenga menos de 5g de carbohidratos por porcin.  Entre los alimentos libres, se incluyen los siguientes:  Muchos vegetales.  Huevos duros.  Nueces o semillas.  Aceitunas.  Quesos.  Carnes. Estos tipos de alimentos son buenas opciones de bocadillos y en general estn disponibles en los bufs de ensaladas. Como aderezos "libres" para Start[employer], puede usarse jugo de limn, vinagre o un aderezo de bajas caloras (con menos de 20caloras por porcin).  OPCIONES PARA REDUCIR LOS CARBOHIDRATOS  Reemplace el yogur descremado endulzado por el yogur sin azcar. Tambin puede consumir yogur a base de [place], pero es conveniente una opcin sin azcar o natural, porque tiene menos contenido de carbohidratos.  Pdale al mozo que retire la canasta de pan o las papas de la mesa.  Pida frutas frescas. El buf de ensaladas a menudo ofrece frutas frescas. Evite las frutas enlatadas, ya que por lo general tienen azcar o almbar.  Edwyna Perfect  ensalada y cmala sin aderezo. Tambin puede crear un aderezo "libre" para ensaladas.  Pida que le [employer] Media alimentos. Por ejemplo, en lugar de papas fritas, pida una porcin de vegetales, como una ensalada, judas verdes o brcoli. OTROS CONSEJOS   Si [place] insulina, adminstrela una vez que la comida llegue a la mesa, as las Automotive engineer.  Pregntele al mozo sobre el tamao de la porcin antes de pedir la comida y, si la porcin es ms grande de lo que usted debe consumir, pida una caja para llevarse la comida a su casa. Cuando llegue la comida, deje en el plato la cantidad que debe comer y coloque el resto en la caja para llevar.  Considere la posibilidad de Agricultural consultant un plato principal con alguien y de pedir una ensalada como guarnicin.   Esta informacin no tiene Theme park manager el consejo del mdico. Asegrese de hacerle al mdico cualquier pregunta que tenga.   Document Released: 05/23/2005 Document Revised: 02/11/2015 Elsevier Interactive Patient Education Microsoft.

## 2015-11-23 NOTE — Progress Notes (Signed)
Michelle Hood, is a 75 y.o. female  UEA:[id number]  YNW:[id number]  DOB - 1940-08-27  Chief Complaint  Patient presents with  . Knee Pain        Subjective:   Michelle Hood is a 75 y.o. female here today for a follow up visit, htn and knee pains.  Knee pains bilaterally tolerable, c/o of some constipation as well.  No n/v/f/c/ha/visual changes, has some near sighted vision problems when reading/chronic.  No other c/o.  Per pt and family, trying to watch salt and eat less tortillas recently. They deny added extra salt.   Patient has No headache, No chest pain, No abdominal pain - No Nausea, No new weakness tingling or numbness, No Cough - SOB.   Interpreter was used to communicate directly with patient for the entire encounter including providing detailed patient instructions.  Problem  Htn (Hypertension)  Djd (Degenerative Joint Disease) of Knee   bilateral     ALLERGIES: No Known Allergies  PAST MEDICAL HISTORY: No past medical history on file.  MEDICATIONS AT HOME: Prior to Admission medications   Medication Sig Start Date End Date Taking? Authorizing Provider  diclofenac sodium (VOLTAREN) 1 % GEL Apply 2 g topically 4 (four) times daily. 11/03/15  Yes Pete Glatter, MD  loratadine (CLARITIN) 10 MG tablet Take 1 tablet (10 mg total) by mouth daily. 11/03/15  Yes Pete Glatter, MD  metoprolol (LOPRESSOR) 50 MG tablet Take 1 tablet (50 mg total) by mouth 2 (two) times daily. 11/03/15  Yes Garnetta Fedrick Marland Mcalpine, MD  Soft Lens Products (SALINE SENSITIVE EYES) SOLN 1 application by Does not apply route every 3 (three) hours as needed. 11/03/15  Yes Pete Glatter, MD  traMADol (ULTRAM) 50 MG tablet Take 1 tablet (50 mg total) by mouth every 8 (eight) hours as needed. 11/03/15  Yes Pete Glatter, MD  lisinopril-hydrochlorothiazide (PRINZIDE,ZESTORETIC) 20-25 MG tablet Take 1 tablet by mouth daily. 11/23/15   Pete Glatter, MD  metFORMIN (GLUCOPHAGE XR)  500 MG 24 hr tablet Take 1 tablet (500 mg total) by mouth daily with breakfast. 11/23/15   Pete Glatter, MD     Objective:   Filed Vitals:   11/23/15 1009  BP: 185/86  Pulse: 59  Temp: 98.6 F (37 C)  TempSrc: Oral  Resp: 18  Height:  (1.422 m)  Weight: 170 lb (77.111 kg)  SpO2: 98%    Exam General appearance : Awake, alert, not in any distress. Speech Clear. Not toxic looking, short statured, pleasant. HEENT: Atraumatic and Normocephalic, pupils equally reactive to light. Neck: supple, no JVD.  Chest:Good air entry bilaterally, no added sounds. CVS: S1 S2 regular, no murmurs/gallups or rubs. Abdomen: Bowel sounds active, Non tender and not distended with no gaurding, rigidity or rebound. Extremities: B/L Lower Ext shows no edema, both legs are warm to touch, numerous varicose veins. Neurology: Awake alert, and oriented X 3, CN II-XII grossly intact, Non focal Skin:No Rash  Data Review Lab Results  Component Value Date   HGBA1C 6.0* 11/03/2015    Depression screen PHQ 2/9 11/03/2015  Decreased Interest 0  Down, Depressed, Hopeless 0  PHQ - 2 Score 0  Altered sleeping 0  Tired, decreased energy 0  Change in appetite 0  Feeling bad or failure about yourself  0  Trouble concentrating 0  Moving slowly or fidgety/restless 0  Suicidal thoughts 0  PHQ-9 Score 0  Difficult doing work/chores Not difficult at all  Assessment & Plan   1. HTN (hypertension), malignant Uncontrolled on metroprolol and hctz, hr borderline, no acute warning signs, will hold off on clonidine. - change hctz to prinzide 20-25 qd - continue bb - f/u 2 wks for bp chk - dw pt/family low salt diet, watch hidden salt intake in canned goods, eating out, etc  2. Primary osteoarthritis of both knees Tolerable , keep ortho appt 6/28  3. Prediabetes Dm diet discussed - metformin xr 500 qd  4. Constipation - metformin may cause some diarrhea, so would balance out w/ constipation,  recd high fiber diet.    Patient have been counseled extensively about nutrition and exercise  Return in about 2 weeks (around 12/07/2015) for bad htn /bp chk .  The patient was given clear instructions to go to ER or return to medical center if symptoms don't improve, worsen or new problems develop. The patient verbalized understanding. The patient was told to call to get lab results if they haven't heard anything in the next week.    Pete Glatterawn T Darneisha Windhorst, MD, MBA/MHA [facility] [place], [place] [phone]   11/23/2015, 10:36 AM

## 2015-11-23 NOTE — Progress Notes (Signed)
Patient is here BP and knee pain  Patient complains of left knee pain being present. Pain is scaled currently at a 8. Pain is increasing with walking.  Patient has taken medication today and patient has eaten today.  Patient denies HA or blurred vision at this time.

## 2015-12-02 ENCOUNTER — Other Ambulatory Visit: Payer: Self-pay

## 2015-12-02 ENCOUNTER — Ambulatory Visit (INDEPENDENT_AMBULATORY_CARE_PROVIDER_SITE_OTHER): Payer: Self-pay | Admitting: Family Medicine

## 2015-12-02 ENCOUNTER — Encounter (HOSPITAL_COMMUNITY): Payer: Self-pay

## 2015-12-02 ENCOUNTER — Encounter: Payer: Self-pay | Admitting: Family Medicine

## 2015-12-02 ENCOUNTER — Emergency Department (HOSPITAL_COMMUNITY)
Admission: EM | Admit: 2015-12-02 | Discharge: 2015-12-02 | Disposition: A | Discharge status: Home / Self Care | Payer: Self-pay | Attending: Emergency Medicine | Admitting: Emergency Medicine

## 2015-12-02 VITALS — BP 224/94 | Ht <= 58 in | Wt 170.0 lb

## 2015-12-02 DIAGNOSIS — Z7984 Long term (current) use of oral hypoglycemic drugs: Secondary | ICD-10-CM | POA: Insufficient documentation

## 2015-12-02 DIAGNOSIS — I159 Secondary hypertension, unspecified: Secondary | ICD-10-CM

## 2015-12-02 DIAGNOSIS — Z79899 Other long term (current) drug therapy: Secondary | ICD-10-CM | POA: Insufficient documentation

## 2015-12-02 DIAGNOSIS — I160 Hypertensive urgency: Secondary | ICD-10-CM

## 2015-12-02 DIAGNOSIS — E119 Type 2 diabetes mellitus without complications: Secondary | ICD-10-CM | POA: Insufficient documentation

## 2015-12-02 DIAGNOSIS — M170 Bilateral primary osteoarthritis of knee: Secondary | ICD-10-CM

## 2015-12-02 HISTORY — DX: Essential (primary) hypertension: I10

## 2015-12-02 HISTORY — DX: Type 2 diabetes mellitus without complications: E11.9

## 2015-12-02 LAB — BASIC METABOLIC PANEL
Anion gap: 8 (ref 5–15)
BUN: 21 mg/dL — AB (ref 6–20)
CALCIUM: 9.6 mg/dL (ref 8.9–10.3)
CO2: 29 mmol/L (ref 22–32)
Chloride: 102 mmol/L (ref 101–111)
Creatinine, Ser: 0.88 mg/dL (ref 0.44–1.00)
GFR calc Af Amer: >60 mL/min (ref >60)
GLUCOSE: 107 mg/dL — AB (ref 65–99)
Potassium: 3.8 mmol/L (ref 3.5–5.1)
SODIUM: 139 mmol/L (ref 135–145)

## 2015-12-02 LAB — I-STAT TROPONIN, ED
TROPONIN I, POC: 0 ng/mL (ref 0.00–0.08)
Troponin i, poc: 0 ng/mL (ref 0.00–0.08)

## 2015-12-02 LAB — CBC
HCT: 46.2 % — ABNORMAL HIGH (ref 36.0–46.0)
Hemoglobin: 14.8 g/dL (ref 12.0–15.0)
MCH: 27.5 pg (ref 26.0–34.0)
MCHC: 32 g/dL (ref 30.0–36.0)
MCV: 85.9 fL (ref 78.0–100.0)
PLATELETS: 235 10*3/uL (ref 150–400)
RBC: 5.38 MIL/uL — ABNORMAL HIGH (ref 3.87–5.11)
RDW: 13.8 % (ref 11.5–15.5)
WBC: 9.6 10*3/uL (ref 4.0–10.5)

## 2015-12-02 NOTE — Progress Notes (Signed)
Michelle Hood is a 75 y.o. female who presents today for knee problems.  Hypertensive urgency-patient presents today with a blood pressure 224/95 after sitting in her right arm. She denies any symptoms including chest pain shortness of breath headaches blurred vision double vision paresthesias or numbness. She does have a history of poorly controlled hypertension on multiple medications. No changes since last visit.  No past medical history on file.  History  Smoking status  . Never Smoker   Smokeless tobacco  . Not on file    No family history on file.  Current Outpatient Prescriptions on File Prior to Visit  Medication Sig Dispense Refill  . diclofenac sodium (VOLTAREN) 1 % GEL Apply 2 g topically 4 (four) times daily. 100 g 2  . lisinopril-hydrochlorothiazide (PRINZIDE,ZESTORETIC) 20-25 MG tablet Take 1 tablet by mouth daily. 90 tablet 3  . loratadine (CLARITIN) 10 MG tablet Take 1 tablet (10 mg total) by mouth daily. 30 tablet 11  . metFORMIN (GLUCOPHAGE XR) 500 MG 24 hr tablet Take 1 tablet (500 mg total) by mouth daily with breakfast. 90 tablet 3  . metoprolol (LOPRESSOR) 50 MG tablet Take 1 tablet (50 mg total) by mouth 2 (two) times daily. 180 tablet 3  . Soft Lens Products (SALINE SENSITIVE EYES) SOLN 1 application by Does not apply route every 3 (three) hours as needed. 1 Bottle 3  . traMADol (ULTRAM) 50 MG tablet Take 1 tablet (50 mg total) by mouth every 8 (eight) hours as needed. 30 tablet 0   No current facility-administered medications on file prior to visit.    ROS: Per HPI.  All other systems reviewed and are negative.   Physical Exam Filed Vitals:   12/02/15 1434  BP: 224/94    Physical Examination: General appearance - alert, well appearing, and in no distress Neck - carotids upstroke normal bilaterally, no bruits Chest - clear to auscultation, no wheezes, rales or rhonchi, symmetric air entry Heart - normal rate and regular rhythm, no murmurs  noted    Chemistry      Component Value Date/Time   NA 138 11/03/2015 1621   K 4.3 11/03/2015 1621   CL 100 11/03/2015 1621   CO2 26 11/03/2015 1621   BUN 30* 11/03/2015 1621   CREATININE 0.90 11/03/2015 1621      Component Value Date/Time   CALCIUM 9.7 11/03/2015 1621      Lab Results  Component Value Date   WBC 10.0 11/03/2015   HGB 14.6 11/03/2015   HCT 43.9 11/03/2015   MCV 84.4 11/03/2015   PLT 268 11/03/2015   No results found for: TSH Lab Results  Component Value Date   HGBA1C 6.0* 11/03/2015

## 2015-12-02 NOTE — Assessment & Plan Note (Signed)
Patient with severely elevated blood pressure to 224/95. Not currently having symptoms but she is at extreme risk for end organ damage. Highly recommend that we transport her by EMS to the emergency room for the proper lab workup to rule out acute end organ damage. She also will probably need to be monitored with the reduction on her blood pressure of 25% or so over the next 24 hours.

## 2015-12-02 NOTE — Discharge Instructions (Signed)
Hipertensión  (Hypertension)  El término hipertensión es otra forma de denominar a la presión arterial elevada. La presión arterial elevada fuerza al corazón a trabajar más para bombear la sangre. Una lectura de la presión arterial consta de dos números: uno más alto sobre uno más bajo (por ejemplo, 110/72).  CUIDADOS EN EL HOGAR   · Haga que el médico le tome nuevamente la presión arterial.  · Tome los medicamentos solamente como se lo haya indicado el médico. Siga cuidadosamente las indicaciones. Los medicamentos pierden eficacia si omite dosis. El hecho de omitir las dosis también aumenta el riesgo de otros problemas.  · No fume.  · Contrólese la presión arterial en su casa como se lo haya indicado el médico.  SOLICITE AYUDA SI:  · Piensa que tiene una reacción a los medicamentos que está tomando.  · Tiene mareos o dolores de cabeza reiterados.  · Se le inflaman (hinchan) los tobillos.  · Tiene problemas de visión.  SOLICITE AYUDA DE INMEDIATO SI:   · Tiene un dolor de cabeza muy intenso y está confundido.  · Se siente débil, aturdido o se desmaya.  · Tiene dolor en el pecho o el estómago (abdominal).  · Tiene vómitos.  · No puede respirar muy bien.  ASEGÚRESE DE QUE:   · Comprende estas instrucciones.  · Controlará su afección.  · Recibirá ayuda de inmediato si no mejora o si empeora.     Esta información no tiene como fin reemplazar el consejo del médico. Asegúrese de hacerle al médico cualquier pregunta que tenga.     Document Released: 11/10/2009 Document Revised: 05/28/2013  Elsevier Interactive Patient Education ©2016 Elsevier Inc.

## 2015-12-02 NOTE — Patient Instructions (Addendum)
Sending patient to ED for hypertensive-urgency. Family is transporting patient.

## 2015-12-02 NOTE — ED Notes (Signed)
Patient sent from doctors office after going today to be evaluated for bilateral knee pain for several months. MD sent patient here because of elevated BP in office, takes her meds daily for same.  Denies any pain or symptoms other than her knee pain

## 2015-12-03 NOTE — ED Provider Notes (Signed)
CSN: [id number]     Arrival date & time 12/02/15  1530 History   First MD Initiated Contact with Patient 12/02/15 1710     Chief Complaint  Patient presents with  . Hypertension     (Consider location/radiation/quality/duration/timing/severity/associated sxs/prior Treatment) Patient is a 75 y.o. female presenting with hypertension. The history is provided by the patient.  Hypertension This is a new problem. The current episode started less than 1 hour ago. The problem occurs constantly. The problem has not changed since onset.Pertinent negatives include no chest pain, no headaches and no shortness of breath. Nothing aggravates the symptoms. Nothing relieves the symptoms. She has tried nothing for the symptoms. The treatment provided no relief.   75 yo F with hypertension noted at ortho clinic.  Sent here for eval.  Patient denies symptoms other than bilateral knee pain which is chronic, worse with movement, worsening throughout the day.  Denies injury.   Past Medical History  Diagnosis Date  . Hypertension   . Diabetes mellitus without complication (HCC)    History reviewed. No pertinent past surgical history. No family history on file. Social History  Substance Use Topics  . Smoking status: Never Smoker   . Smokeless tobacco: None  . Alcohol Use: No   OB History    No data available     Review of Systems  Constitutional: Negative for fever and chills.  HENT: Negative for congestion and rhinorrhea.   Eyes: Negative for redness and visual disturbance.  Respiratory: Negative for shortness of breath and wheezing.   Cardiovascular: Negative for chest pain and palpitations.  Gastrointestinal: Negative for nausea and vomiting.  Genitourinary: Negative for dysuria and urgency.  Musculoskeletal: Negative for myalgias and arthralgias.  Skin: Negative for pallor and wound.  Neurological: Negative for dizziness and headaches.      Allergies  Review of patient's allergies  indicates no known allergies.  Home Medications   Prior to Admission medications   Medication Sig Start Date End Date Taking? Authorizing Provider  diclofenac sodium (VOLTAREN) 1 % GEL Apply 2 g topically 4 (four) times daily. 11/03/15  Yes Pete Glatterawn T Langeland, MD  hydrochlorothiazide (HYDRODIURIL) 25 MG tablet Take 25 mg by mouth daily.  11/03/15  Yes Historical Provider, MD  lisinopril-hydrochlorothiazide (PRINZIDE,ZESTORETIC) 20-25 MG tablet Take 1 tablet by mouth daily. 11/23/15  Yes Pete Glatterawn T Langeland, MD  metFORMIN (GLUCOPHAGE XR) 500 MG 24 hr tablet Take 1 tablet (500 mg total) by mouth daily with breakfast. 11/23/15  Yes Pete Glatterawn T Langeland, MD  traMADol (ULTRAM) 50 MG tablet Take 1 tablet (50 mg total) by mouth every 8 (eight) hours as needed. 11/03/15  Yes Pete Glatterawn T Langeland, MD  loratadine (CLARITIN) 10 MG tablet Take 1 tablet (10 mg total) by mouth daily. Patient not taking: Reported on 12/02/2015 11/03/15   Pete Glatterawn T Langeland, MD  metoprolol (LOPRESSOR) 50 MG tablet Take 1 tablet (50 mg total) by mouth 2 (two) times daily. Patient not taking: Reported on 12/02/2015 11/03/15   Pete Glatterawn T Langeland, MD  Soft Lens Products (SALINE SENSITIVE EYES) SOLN 1 application by Does not apply route every 3 (three) hours as needed. Patient not taking: Reported on 12/02/2015 11/03/15   Pete Glatterawn T Langeland, MD   BP 213/77 mmHg  Pulse 60  Temp(Src) 98.1 F (36.7 C) (Oral)  Resp 19  Wt 170 lb (77.111 kg)  SpO2 98% Physical Exam  Constitutional: She is oriented to person, place, and time. She appears well-developed and well-nourished. No distress.  HENT:  Head: Normocephalic and atraumatic.  Eyes: EOM are normal. Pupils are equal, round, and reactive to light.  Neck: Normal range of motion. Neck supple.  Cardiovascular: Normal rate and regular rhythm.  Exam reveals no gallop and no friction rub.   No murmur heard. Pulmonary/Chest: Effort normal. She has no wheezes. She has no rales.  Abdominal: Soft. She exhibits no  distension. There is no tenderness.  Musculoskeletal: She exhibits no edema or tenderness.  No noted swelling or palpable pain to bilateral knees.  Some creaking of joint with movement.   Neurological: She is alert and oriented to person, place, and time.  Skin: Skin is warm and dry. She is not diaphoretic.  Psychiatric: She has a normal mood and affect. Her behavior is normal.  Nursing note and vitals reviewed.   ED Course  Procedures (including critical care time) Labs Review Labs Reviewed  BASIC METABOLIC PANEL - Abnormal; Notable for the following:    Glucose, Bld 107 (*)    BUN 21 (*)    All other components within normal limits  CBC - Abnormal; Notable for the following:    RBC 5.38 (*)    HCT 46.2 (*)    All other components within normal limits  I-STAT TROPOININ, ED  I-STAT TROPOININ, ED    Imaging Review No results found. I have personally reviewed and evaluated these images and lab results as part of my medical decision-making.   EKG Interpretation   Date/Time:  Wednesday December 02 2015 15:41:49 EDT Ventricular Rate:  60 PR Interval:  178 QRS Duration: 80 QT Interval:  400 QTC Calculation: 400 R Axis:   44 Text Interpretation:  Normal sinus rhythm Nonspecific T wave abnormality  Abnormal ECG No old tracing to compare Confirmed by Genola Yuille MD, DANIEL  [phone]) on 12/02/2015 5:36:01 PM      MDM   Final diagnoses:  Secondary hypertension, unspecified  Primary osteoarthritis of both knees    Patient asymptomatic with no noted s/s of end organ damage.  No chest pain, diaphoresis, nausea or other acs symptoms.  No headache or neurologic complaints, no eye complaints, no unequal pulses, normal pulse ox without rales or sob.  Feel this is unlikely to be a Hypertensive Emergency and recent studies suggest no benefit for inpatient admission.  There are also no studies to my knowledge suggesting that patients with hypertensive urgency have increased risk for end organ  disease.The patient will follow up closely with their PCP.  Compliance with their medication stressed.    Chester Holstein, Christell Constant EH, et al. Characteristics and outcomes of patients presenting with hypertensive urgency in the office setting. JAMA Intern Med. 2016 Jul 1; 176(7): 981-8.     I have discussed the diagnosis/risks/treatment options with the patient and believe the pt to be eligible for discharge home to follow-up with PCP. We also discussed returning to the ED immediately if new or worsening sx occur. We discussed the sx which are most concerning (e.g., sudden worsening pain, fever, inability to tolerate by mouth) that necessitate immediate return. Medications administered to the patient during their visit and any new prescriptions provided to the patient are listed below.  Medications given during this visit Medications - No data to display  Discharge Medication List as of 12/02/2015  5:48 PM      The patient appears reasonably screen and/or stabilized for discharge and I doubt any other medical condition or other [facility] requiring further screening, evaluation, or treatment in the ED at this  time prior to discharge.    Melene Planan Phil Michels, DO 12/03/15 1021

## 2015-12-04 ENCOUNTER — Telehealth: Payer: Self-pay | Admitting: Internal Medicine

## 2015-12-04 NOTE — Telephone Encounter (Signed)
Pts daughter called stating that her BP medication is not  Knee pain checked, bp is high

## 2015-12-09 ENCOUNTER — Ambulatory Visit: Payer: Self-pay | Attending: Internal Medicine | Admitting: Pharmacist

## 2015-12-09 VITALS — BP 171/103 | HR 78

## 2015-12-09 DIAGNOSIS — I10 Essential (primary) hypertension: Secondary | ICD-10-CM

## 2015-12-09 MED FILL — ?METOPROLOL 50 MG TABLET: 50 | 30 days supply | Qty: 60 | Fill #1

## 2015-12-09 NOTE — Patient Instructions (Signed)
Thanks for coming to see me  Come back in 1 week to see me for a blood pressure check  Hipertensin (Hypertension) El trmino hipertensin es otra forma de denominar a la presin arterial elevada. La presin arterial elevada fuerza al corazn a trabajar ms para bombear la sangre. Una lectura de la presin arterial consta de dos nmeros: uno ms alto sobre uno ms bajo (por ejemplo, 110/72). CUIDADOS EN EL HOGAR   Haga que el mdico le tome nuevamente la presin arterial.  Tome los medicamentos solamente como se lo haya indicado el mdico. Siga cuidadosamente las indicaciones. Los medicamentos pierden eficacia si omite dosis. El hecho de omitir las dosis tambin [place] el riesgo de otros problemas.  No fume.  Contrlese la presin arterial en su casa como se lo haya indicado el mdico. SOLICITE AYUDA SI:  Piensa que tiene una reaccin a los medicamentos que est tomando.  Tiene mareos o dolores de cabeza reiterados.  Se le inflaman (hinchan) los tobillos.  Tiene problemas de visin. SOLICITE AYUDA DE INMEDIATO SI:   Tiene un dolor de cabeza muy intenso y est confundido.  Se siente dbil, aturdido o se desmaya.  Tiene dolor en el pecho o el estmago (abdominal).  Tiene vmitos.  No puede respirar [employer]Clarkmuy bien. ASEGRESE DE QUE:   Comprende estas instrucciones.  Controlar su afeccin.  Recibir ayuda de inmediato si no mejora o si empeora.   Esta informacin no tiene Theme park managercomo fin reemplazar el consejo del mdico. Asegrese de hacerle al mdico cualquier pregunta que tenga.   Document Released: 11/10/2009 Document Revised: 05/28/2013 Elsevier Interactive Patient Education [employer]2016 Elsevier Inc.

## 2015-12-09 NOTE — Progress Notes (Signed)
   S:    Patient arrives in good spirits with her daughters.    Presents to the clinic for hypertension evaluation. Patient was referred on 11/23/15.  Patient was last seen by Primary Care Provider on 11/23/15. [address], Silvestre MesiCristina [phone], was used for the entirety of the visit.   Patient denies adherence with medications. She has been out of one of her medications for a few days but she is not sure which one.   Current BP Medications include:  Metoprolol tartrate 50 mg BID, lisinopril-HCTZ 20-25 mg daily, and she also takes HCTZ 25 mg daily.  Dietary habits include: patient's meals are cooked at home and no salt is added to meals. She avoids processed foods which may contain a lot of salt.    O:   Last 3 Office BP readings: BP Readings from Last 3 Encounters:  12/09/15 171/103  12/02/15 213/77  12/02/15 224/94    BMET    Component Value Date/Time   NA 139 12/02/2015 1546   K 3.8 12/02/2015 1546   CL 102 12/02/2015 1546   CO2 29 12/02/2015 1546   GLUCOSE 107* 12/02/2015 1546   BUN 21* 12/02/2015 1546   CREATININE 0.88 12/02/2015 1546   CREATININE 0.90 11/03/2015 1621   CALCIUM 9.6 12/02/2015 1546   GFRNONAA >60 12/02/2015 1546   GFRNONAA 63 11/03/2015 1621   GFRAA >60 12/02/2015 1546   GFRAA 73 11/03/2015 1621    A/P: Hypertension longstanding currently UNcontrolled on current medications.  Continued all medications but instructed patient to stop the HCTZ 25 mg as instructed by Dr. Julien NordmannLangeland. Until I know what medications she is actually taking, I do not want to change anything.  Reviewed her medications with her and placed a heart next to her two blood pressure medications on her printed med list so that she would know which ones those were. Patient to make sure she is taking both the lisinopril-HCTZ and metoprolol and to be taking both when she comes to see me next week.    Results reviewed and written information provided.   Total time in face-to-face counseling 20  minutes.  F/U Clinic Visit with me in 1 week.  Patient seen with Ginger CarneJennifer Otter, PharmD Candidate.

## 2015-12-10 ENCOUNTER — Encounter: Payer: Self-pay | Admitting: Pharmacist

## 2015-12-16 ENCOUNTER — Encounter: Payer: Self-pay | Admitting: Pharmacist

## 2015-12-25 MED FILL — LISINOPRIL-HCTZ 20-25 MG TA: 20-25 | 30 days supply | Qty: 30 | Fill #1

## 2016-01-05 MED FILL — METFORMIN HCL ER 500 MG TAB: 500 | 30 days supply | Qty: 30 | Fill #1

## 2016-01-13 MED FILL — METOPROLOL TARTRATE 50 MG T: 50 | 30 days supply | Qty: 60 | Fill #2

## 2016-01-19 ENCOUNTER — Encounter: Payer: Self-pay | Admitting: Pharmacist

## 2016-01-19 ENCOUNTER — Ambulatory Visit: Payer: Self-pay | Attending: Internal Medicine | Admitting: Pharmacist

## 2016-01-19 VITALS — BP 180/104 | HR 52

## 2016-01-19 DIAGNOSIS — Z79899 Other long term (current) drug therapy: Secondary | ICD-10-CM | POA: Insufficient documentation

## 2016-01-19 DIAGNOSIS — I10 Essential (primary) hypertension: Secondary | ICD-10-CM

## 2016-01-19 MED ORDER — AMLODIPINE BESYLATE 5 MG PO TABS: 5.0000 mg | ORAL_TABLET | Freq: Every day | ORAL | 3 refills | Status: AC

## 2016-01-19 MED FILL — ?AMLODIPINE BESYLATE 5 MG T: 5 | 30 days supply | Qty: 30 | Fill #0

## 2016-01-19 NOTE — Progress Notes (Signed)
   S:    Patient arrives in good spirits with daughter and grandaughter.    Presents to the clinic for hypertension evaluation. Patient was referred on 11/23/15.  Patient was last seen by Primary Care Provider on 11/23/15. Interpreter [place] [phone] was used for the entirety of the visit.   Patient reports adherence with medications.  Current BP Medications include:  Metoprolol tartrate 50mg  PO BID, Lisinopril-HCTZ 20-25mg  PO QD  Antihypertensives tried in the past include: HCTZ 25mg    O:   Last 3 Office BP readings: BP Readings from Last 3 Encounters:  01/19/16 (!) 180/104  12/09/15 (!) 171/103  12/02/15 (!) 213/77    BMET    Component Value Date/Time   NA 139 12/02/2015 1546   K 3.8 12/02/2015 1546   CL 102 12/02/2015 1546   CO2 29 12/02/2015 1546   GLUCOSE 107 (H) 12/02/2015 1546   BUN 21 (H) 12/02/2015 1546   CREATININE 0.88 12/02/2015 1546   CREATININE 0.90 11/03/2015 1621   CALCIUM 9.6 12/02/2015 1546   GFRNONAA >60 12/02/2015 1546   GFRNONAA 63 11/03/2015 1621   GFRAA >60 12/02/2015 1546   GFRAA 73 11/03/2015 1621    A/P: Hypertension longstanding currently UNcontrolled on current medications. BP today of 180/104 with pulse in the 50s. Goal BP <140/90.  Patient not administered clonidine in clinic due to low HR to avoid worsening bradycardia. Discontinued Metoprolol due to bradycardia. Start Amlodipine 5mg  PO QD to replace metoprolol. Continue Lisinopril-HCTZ.Patient educated about amlodipine and questions answered.  Results reviewed and written information provided.   Total time in face-to-face counseling 20 minutes.   F/U Clinic Visit with me in 1 week.  Patient seen with Tasia CatchingsZach Lingle, PharmD Candidate

## 2016-01-19 NOTE — Patient Instructions (Addendum)
Come back in 1 week  Start amlodipine 5 mg daily  Stop metoprolol   Amlodipine tablets Qu es este medicamento? La AMLODIPINA es un bloqueador de los canales de calcio. Afecta la cantidad de calcio en las clulas del corazn y de los msculos. Esto produce una relajacin de los vasos sanguneos, lo que puede reducir la carga de trabajo del corazn. Este medicamento reduce la alta presin sangunea. Adems se [place]) Islandsutiliza para Ship brokerprevenir el dolor en el pecho. Este medicamento puede ser utilizado para otros usos; si tiene alguna pregunta consulte con su proveedor de atencin mdica o con su farmacutico. Qu le debo informar a mi profesional de la salud antes de tomar este medicamento? Necesita saber si usted presenta alguno de los Coventry Health Caresiguientes problemas o situaciones: -problemas cardiacos, tales como insuficiencia cardiaca o estenosis artica -enfermedad heptica -una reaccin alrgica o inusual a la amlodipina, a otros medicamentos, alimentos, colorantes o conservantes -si est embarazada o buscando quedar embarazada -si est amamantando a un beb Cmo debo utilizar este medicamento? Tome este medicamento por va oral con un vaso de agua. Siga las instrucciones de la etiqueta del Nemahamedicamento. Tome sus dosis a intervalos regulares. No tome su medicamento con una frecuencia mayor a la indicada. Hable con su pediatra para informarse acerca del uso de este medicamento en nios. Puede requerir atencin especial. Este medicamento ha sido recetado a nios tan menores como de 6 aos de [place]. Los pacientes de ms de 65 aos de edad pueden presentar reacciones ms fuertes a PPL Corporationeste medicamento y Pension scheme managernecesitar dosis menores. Sobredosis: Pngase en contacto inmediatamente con un centro toxicolgico o una sala de urgencia si usted cree que haya tomado demasiado medicamento. ATENCIN: Reynolds AmericanEste medicamento es solo para usted. No comparta este medicamento con nadie. Qu sucede si me olvido de una dosis? Si olvida una  dosis, tmela lo antes posible. Si es casi la hora de la prxima dosis, tome slo esa dosis. No tome dosis adicionales o dobles. Qu puede interactuar con este medicamento? -suplementos dietticos o a base de hierbas -anestsicos locales o generales -medicamentos para la alta presin sangunea -medicamentos para problemas de prstata -rifampicina Puede ser que esta lista no menciona todas las posibles interacciones. Informe a su profesional de Beazer Homesla salud de [employer] Inctodos los productos a base de hierbas, medicamentos de Emerald Isleventa libre o suplementos nutritivos que est tomando. Si usted fuma, consume bebidas alcohlicas o si utiliza drogas ilegales, indqueselo tambin a su profesional de Beazer Homesla salud. Algunas sustancias pueden interactuar con su medicamento. A qu debo estar atento al usar PPL Corporationeste medicamento? Visite a su mdico o a su profesional de la salud para chequear su evolucin peridicamente. Es importante que controle su presin sangunea y Tax inspectorfrecuencia cardiaca regularmente. Pregunte a su mdico o a su profesional de la salud cul debe ser su presin sangunea y frecuencia cardiaca y cundo deber comunicarse con l o ella. Este medicamento puede hacerle sentirse confundido, mareado o aturdido. No conduzca ni utilice maquinaria, ni haga nada que Scientist, research (life sciences)le exija permanecer en estado de alerta hasta que sepa cmo le afecta este medicamento. Para reducir el riesgo de mareos o [place]desmayos, no se siente ni se ponga de pie con rapidez, especialmente si es un paciente de edad avanzada. Evite las bebidas alcohlicas ya que pueden provocarle mareos. No suspenda la amlodipina de Dantemanera abrupta. Consulte a su mdico o a Producer, television/film/videosu profesional de la salud sobre cmo reducir su dosis de Wellsite geologistmanera gradual. Qu efectos secundarios puedo tener al Chemical engineerutilizar este medicamento? Efectos secundarios que debe informar a  su mdico o a su profesional de la salud tan pronto como sea posible: -reacciones alrgicas como erupcin cutnea, picazn o urticarias,  hinchazn de la cara, labios o lengua -problemas respiratorios -cambios en la visin o audicin -dolor en el pecho -pulso cardiaco rpido, irregular -hinchazn de piernas o tobillos Efectos secundarios que, por lo general, no requieren atencin mdica (debe informarlos a su mdico o a su profesional de la salud si persisten o si son molestos): -boca seca -enrojecimiento de la cara -nuseas, vmito -gases o dolor de estmago -debilidad o fatiga -dificultad para dormir Puede ser que esta lista no menciona todos los posibles efectos secundarios. Comunquese a su mdico por asesoramiento mdico Hewlett-Packardsobre los efectos secundarios. Usted puede informar los efectos secundarios a la FDA por telfono al 1-800-FDA-1088. Dnde debo guardar mi medicina? Mantngala fuera del alcance de los nios. Gurdela a Sanmina-SCItemperatura ambiente, entre 15 y 30 grados C ([phone] y [phone] grados F). Protjala de la luz. Mantenga el envase bien cerrado. Deseche todo el medicamento que no haya utilizado, despus de la fecha de vencimiento. ATENCIN: Este folleto es un resumen. Puede ser que no cubra toda la posible informacin. Si usted tiene preguntas acerca de esta medicina, consulte con su mdico, su farmacutico o su profesional de Radiographer, therapeuticla salud.    2016, [employer]vier/[employer]. (2014-07-15 00:00:00)

## 2016-01-27 ENCOUNTER — Ambulatory Visit: Payer: Self-pay | Admitting: Pharmacist

## 2017-09-04 IMAGING — CR DG KNEE COMPLETE 4+V*L*
4 series · 4 of 4 positions shown · non-contrast
Comparison: None.

CLINICAL DATA: Bilateral knee pain for several months, no known
injury, initial encounter

EXAM:
LEFT KNEE - COMPLETE 4+ VIEW

[t knee ap left]
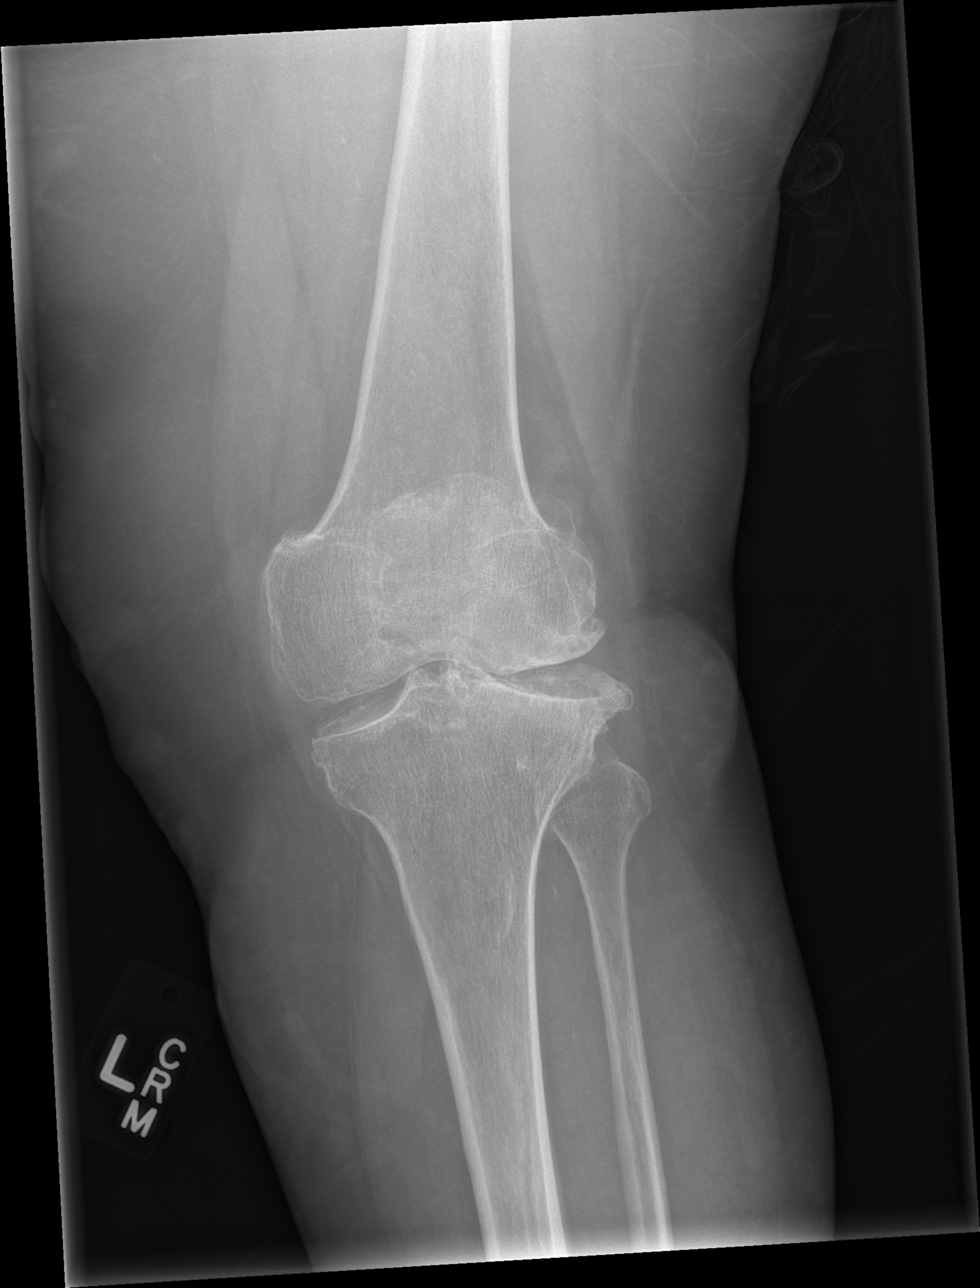

[t knee obl left (1 of 2)]
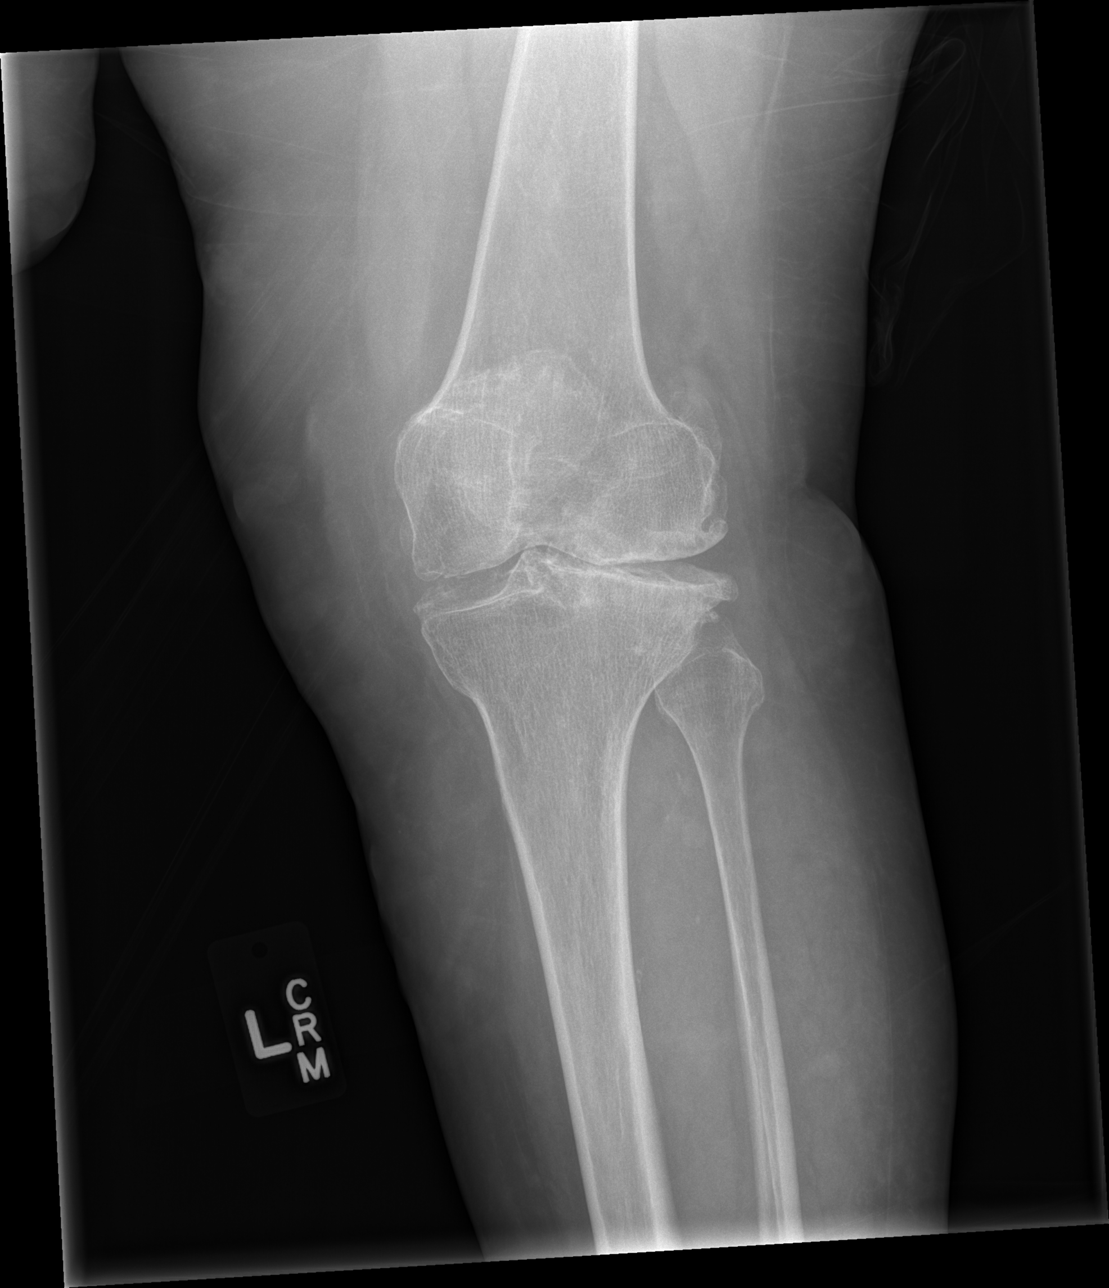

[t knee obl left (2 of 2)]
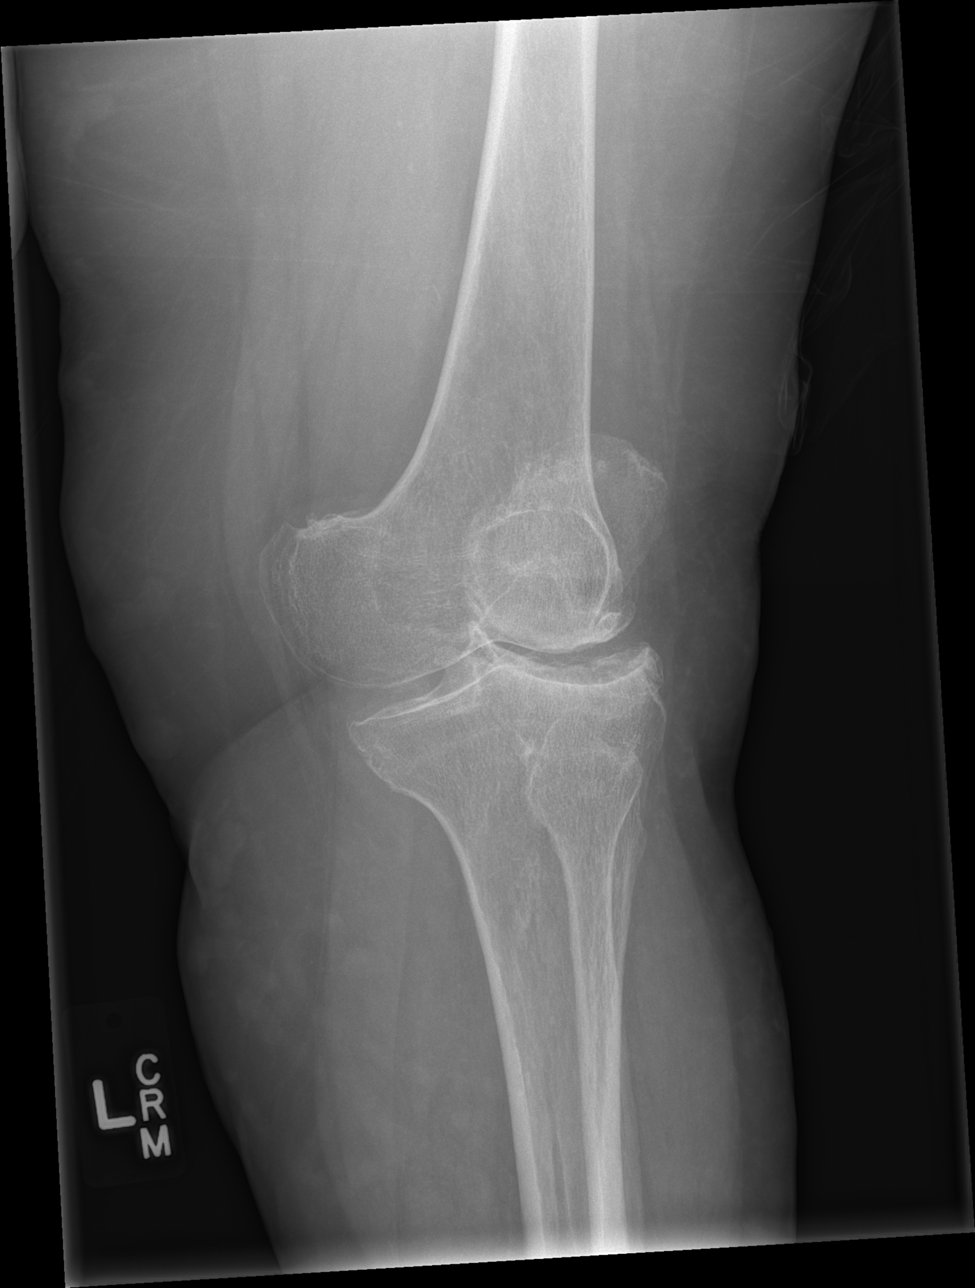

[t knee lat left]
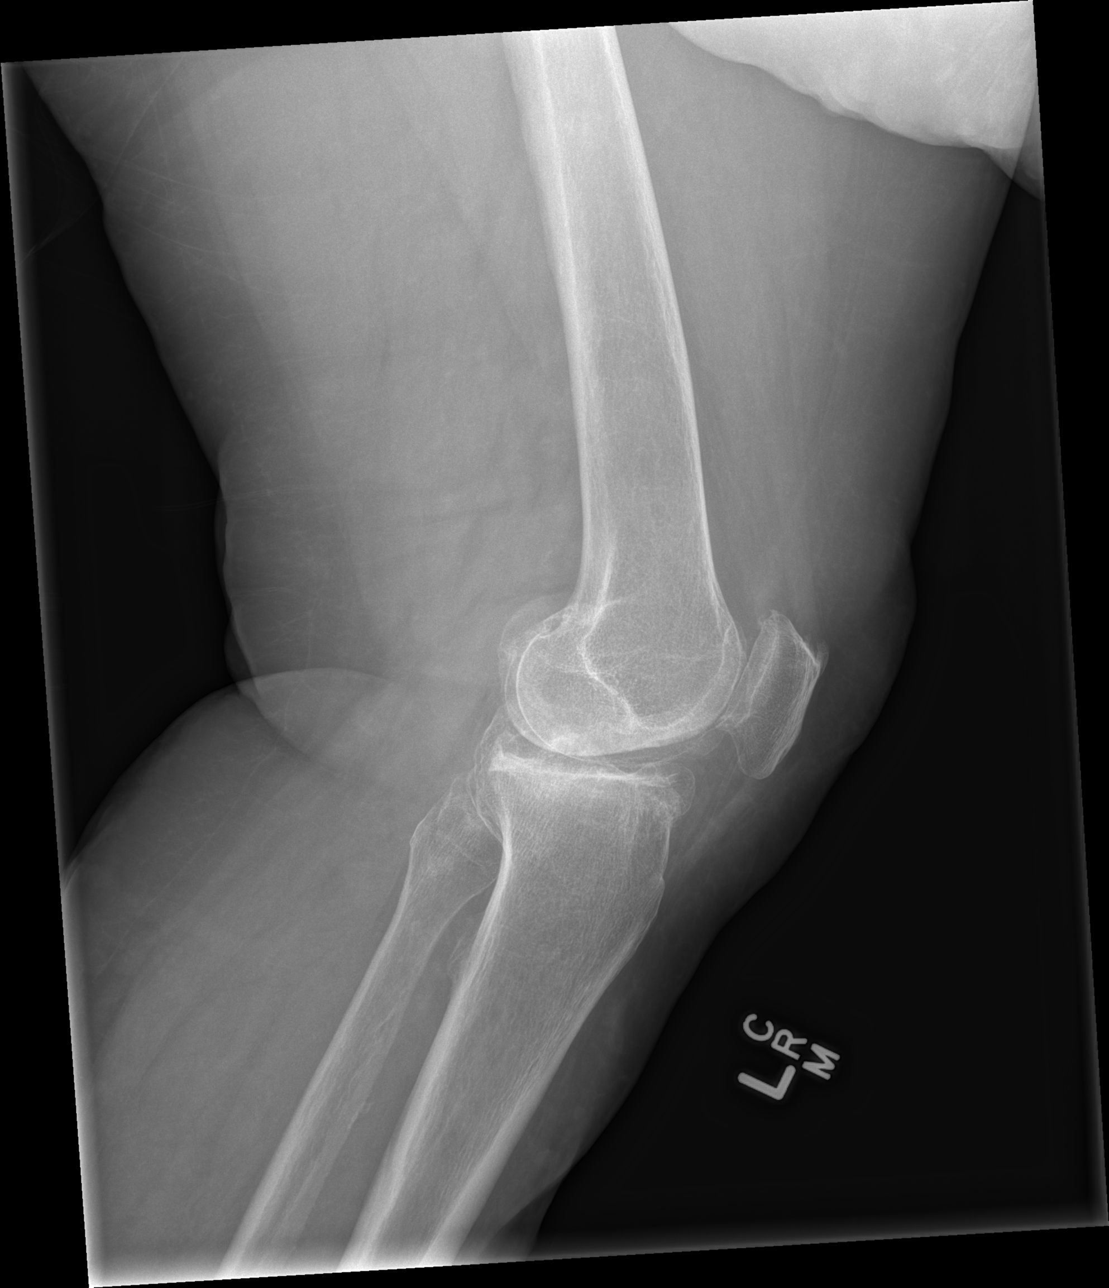

[4 of 4 positions shown; findings below may reference images not displayed]

FINDINGS: Tricompartmental degenerative change is noted. Changes are most
marked in the lateral joint space with narrowing and osteophytic
formation. No significant joint effusion is seen. No gross soft
tissue abnormality is noted.
IMPRESSION: Degenerative change without acute abnormality.
